# Patient Record
Sex: Female | Born: 1970 | Hispanic: No | Marital: Married | Smoking: Never smoker
Health system: Southern US, Community
[De-identification: ages and names within clinical notes are randomized; demographics above are authoritative.]

## PROBLEM LIST (undated history)

## (undated) DIAGNOSIS — D649 Anemia, unspecified: Secondary | ICD-10-CM

## (undated) DIAGNOSIS — I10 Essential (primary) hypertension: Secondary | ICD-10-CM

## (undated) HISTORY — PX: NO PAST SURGERIES: SHX2092

## (undated) HISTORY — DX: Anemia, unspecified: D64.9

---

## 1998-01-13 ENCOUNTER — Other Ambulatory Visit: Admission: RE | Admit: 1998-01-13 | Discharge: 1998-01-13 | Payer: Self-pay | Admitting: Gynecology

## 1998-01-26 ENCOUNTER — Inpatient Hospital Stay (HOSPITAL_COMMUNITY): Admission: AD | Admit: 1998-01-26 | Discharge: 1998-01-26 | Payer: Self-pay | Admitting: Gynecology

## 1998-01-27 ENCOUNTER — Inpatient Hospital Stay (HOSPITAL_COMMUNITY): Admission: AD | Admit: 1998-01-27 | Discharge: 1998-01-28 | Payer: Self-pay | Admitting: Gynecology

## 1998-03-14 ENCOUNTER — Other Ambulatory Visit: Admission: RE | Admit: 1998-03-14 | Discharge: 1998-03-14 | Payer: Self-pay | Admitting: Gynecology

## 2001-04-14 ENCOUNTER — Other Ambulatory Visit: Admission: RE | Admit: 2001-04-14 | Discharge: 2001-04-14 | Payer: Self-pay | Admitting: *Deleted

## 2001-11-12 ENCOUNTER — Inpatient Hospital Stay (HOSPITAL_COMMUNITY): Admission: AD | Admit: 2001-11-12 | Discharge: 2001-11-14 | Payer: Self-pay | Admitting: *Deleted

## 2001-12-22 ENCOUNTER — Other Ambulatory Visit: Admission: RE | Admit: 2001-12-22 | Discharge: 2001-12-22 | Payer: Self-pay | Admitting: Gynecology

## 2002-12-17 ENCOUNTER — Other Ambulatory Visit: Admission: RE | Admit: 2002-12-17 | Discharge: 2002-12-17 | Payer: Self-pay | Admitting: Gynecology

## 2003-12-20 ENCOUNTER — Other Ambulatory Visit: Admission: RE | Admit: 2003-12-20 | Discharge: 2003-12-20 | Payer: Self-pay | Admitting: Gynecology

## 2005-01-31 ENCOUNTER — Other Ambulatory Visit: Admission: RE | Admit: 2005-01-31 | Discharge: 2005-01-31 | Payer: Self-pay | Admitting: Gynecology

## 2006-02-05 ENCOUNTER — Other Ambulatory Visit: Admission: RE | Admit: 2006-02-05 | Discharge: 2006-02-05 | Payer: Self-pay | Admitting: Gynecology

## 2007-01-16 ENCOUNTER — Other Ambulatory Visit: Admission: RE | Admit: 2007-01-16 | Discharge: 2007-01-16 | Payer: Self-pay | Admitting: Obstetrics & Gynecology

## 2008-03-17 ENCOUNTER — Other Ambulatory Visit: Admission: RE | Admit: 2008-03-17 | Discharge: 2008-03-17 | Payer: Self-pay | Admitting: Obstetrics and Gynecology

## 2008-08-20 DIAGNOSIS — I10 Essential (primary) hypertension: Secondary | ICD-10-CM

## 2008-08-20 HISTORY — DX: Essential (primary) hypertension: I10

## 2011-01-05 NOTE — H&P (Signed)
[facility]  Patient:    Sharon Schwartz, Sharon Schwartz Visit Number: [id number] MRN: [id number]          Service Type: Attending:  Gaetano Hawthorne. Lily Peer, M.D. Dictated by:   Gaetano Hawthorne Lily Peer, M.D. Adm. Date:  11/12/01                           History and Physical  CHIEF COMPLAINT:              Contractions.  HISTORY OF PRESENT ILLNESS:   The patient is a 40 year old gravida 2 para 1, with an estimated date of confinement of November 07, 2001, currently 40-5/7th weeks gestation, who presented to [facility] early this morning complaining of contractions that started at approximately 5 a.m. today.  She was scheduled to be induced next week.  She described good fetal movement and no rupture of membranes.  On presentation to [facility] she was found to be dilated approximately 3-4 cm, 80% effaced, -2 station, with contractions every two to three minutes apart, with reassuring fetal heart rate tracing.  PAST MEDICAL HISTORY:         The patient was seen for a prenatal visit at 10-1/[redacted] weeks gestation and did not return back until 20-1/[redacted] weeks gestation because she had gone to [place] to see her family and missed window of opportunity for maternal serum alpha-fetoprotein.  Most of her prenatal course otherwise had been unremarkable.  REVIEW OF SYSTEMS:            See hospital form.  ALLERGIES:                    The patient denies any allergies.  PAST OBSTETRICAL HISTORY:     She had one normal spontaneous vaginal delivery in 1999 and stated otherwise she has been healthy, and no medical problems or issues had been brought up.  PHYSICAL EXAMINATION:  VITAL SIGNS:                  Blood pressure 111/80, pulse 108, respirations 14, temperature 97.9 degrees.  HEENT:                        Unremarkable.  NECK:                         Supple.  Trachea midline.  No carotid bruits. No thyromegaly.  LUNGS:                        Clear to  auscultation without rhonchi or wheezes.  HEART:                        Regular rate and rhythm without murmurs or gallops.  BREAST:                       Examination not done.  ABDOMEN:                      Gravid uterus, vertex presentation by Leopolds maneuver.  Positive fetal heart tones.  PELVIC:                       Cervix 4+ cm, 80-90% effaced, -2 station.  EXTREMITIES:  Deep tendon reflexes normal, negative clonus.  PRENATAL LABORATORY DATA:     Admission laboratories pending at the time of this dictation but prenatally she had had iron deficiency anemia, for which she had been on iron supplementation.  Platelet count had been normal. AB-positive blood type.  Negative antibody screen.  VDRL nonreactive. Hepatitis B surface antigen negative.  Rubella titer with evidence of immunity.  Pap smear normal.  She had an abnormal diabetes screen but a normal three hour GTT.  GBS culture was negative.  ASSESSMENT:                   This is a 40 year old gravida 2 para 1 at 40-5/7th weeks gestation with labor and advanced cervical dilatation, contractions every two to three minutes apart, reassuring fetal heart rate tracing, patient cervix 4 cm, 90% effaced, -2 station.  Underwent artificial rupture of membranes with clear amniotic fluid.  Fetal scalp electrode and intrauterine pressure catheter were placed.  Will continue to monitor contractions.  In the event of protracted labor will initiate Pitocin accordingly.  Her GBS culture was negative.  PLAN:                         Anticipate vaginal delivery. Dictated by:   Gaetano Hawthorne Lily Peer, M.D. Attending:  Gaetano Hawthorne. Lily Peer, M.D. DD:  11/12/01 TD:  11/12/01 Job: [id number]

## 2011-01-05 NOTE — Discharge Summary (Signed)
[facility]  Patient:    Sharon Schwartz, Sharon Schwartz Visit Number: [id number] MRN: [id number]          Service Type: OBS Location: [id number] Attending Physician:  Tonye Royalty Dictated by:   Antony Contras, [facility] Admit Date:  11/12/2001 Discharge Date: 11/14/2001                             Discharge Summary  DISCHARGE DIAGNOSES: 1. Intrauterine pregnancy at term. 2. Recurrent late decelerations.  PROCEDURES:  Outlet forceps delivery of a viable infant over a midline episiotomy, reduction of tight nuchal cord.  HISTORY OF PRESENT ILLNESS:  The patient is a 40 year old, gravida 2, para 1-0-0-1, last menstrual period 02/01/01, estimated date of confinement 11/07/01. Prenatal course was uncomplicated.  LABORATORY DATA:  AB+.  Antibody screen negative.  RPR, HBSA, HIV nonreactive. GBS is negative.  HOSPITAL COURSE AND TREATMENT:  The patient was admitted at 40-5/7 weeks with spontaneous onset of labor.  During labor she did experience some recurrent late decelerations with slowing recovery.  Delivery was accomplished with outlet forceps over a midline episiotomy.  The patient was delivered of an Apgar 7/8 female infant weighing 6 pounds 8 ounces.  Tight nuchal cord was reduced.  The patient also did sustain a second degree laceration which was repaired.  Cord pH was 7.19.  POSTPARTUM COURSE:  The patient remained afebrile, had no difficulty voiding, was able to be discharged on her second postpartum day in satisfactory condition.  CBC showed a hematocrit of 32.5, hemoglobin 11.2, white blood cell count 17, platelets 160.  DISPOSITION:  The patient is to follow up in six weeks.  DISCHARGE MEDICATIONS: 1. Prenatal vitamins. 2. Iron. 3. Motrin for pain. 4. Tylox for pain. Dictated by:   Antony Contras, [facility] Attending Physician:  Tonye Royalty DD:  12/01/01 TD:  12/01/01 Job: [phone]

## 2012-06-03 ENCOUNTER — Other Ambulatory Visit: Payer: Self-pay | Admitting: Obstetrics and Gynecology

## 2012-06-03 DIAGNOSIS — Z1231 Encounter for screening mammogram for malignant neoplasm of breast: Secondary | ICD-10-CM

## 2012-06-16 ENCOUNTER — Ambulatory Visit (HOSPITAL_COMMUNITY)
Admission: RE | Admit: 2012-06-16 | Discharge: 2012-06-16 | Disposition: A | Discharge status: Home / Self Care | Payer: Self-pay | Source: Ambulatory Visit | Attending: Obstetrics and Gynecology | Admitting: Obstetrics and Gynecology

## 2012-06-16 DIAGNOSIS — Z1231 Encounter for screening mammogram for malignant neoplasm of breast: Secondary | ICD-10-CM

## 2012-06-19 ENCOUNTER — Other Ambulatory Visit: Payer: Self-pay | Admitting: Obstetrics and Gynecology

## 2012-06-19 DIAGNOSIS — R928 Other abnormal and inconclusive findings on diagnostic imaging of breast: Secondary | ICD-10-CM

## 2012-06-23 ENCOUNTER — Ambulatory Visit (HOSPITAL_COMMUNITY)
Admission: RE | Admit: 2012-06-23 | Discharge: 2012-06-23 | Disposition: A | Discharge status: Home / Self Care | Payer: Self-pay | Source: Ambulatory Visit | Attending: Obstetrics and Gynecology | Admitting: Obstetrics and Gynecology

## 2012-06-23 ENCOUNTER — Encounter (HOSPITAL_COMMUNITY): Payer: Self-pay

## 2012-06-23 VITALS — BP 124/86 | Temp 98.9°F | Ht <= 58 in | Wt 153.2 lb

## 2012-06-23 DIAGNOSIS — Z1239 Encounter for other screening for malignant neoplasm of breast: Secondary | ICD-10-CM

## 2012-06-23 HISTORY — DX: Essential (primary) hypertension: I10

## 2012-06-23 NOTE — Progress Notes (Signed)
Patient referred to [facility] from the [facility] due to recommendation of additional imaging of the left breast. Screening mammogram was completed 06/16/2012 at [facility].  Pap Smear:    Pap smear not performed today. Per patient last Pap smear was August 2013 and normal. Per patient she has no history of abnormal Pap smears. No Pap smear results in EPIC.  Physical exam: Breasts Breasts symmetrical. No skin abnormalities bilateral breasts. No nipple retraction bilateral breasts. No nipple discharge bilateral breasts. No lymphadenopathy. No lumps palpated bilateral breasts. No complaints of pain or tenderness on exam. Patient referred to the [facility] of [facility] for left breast diagnostic mammogram and possible left breast ultrasound per recommendation. Appointment scheduled for Friday, July 04, 2012 at 1040.         Pelvic/Bimanual No Pap smear completed today since last Pap smear was August 2013 and normal. Pap smear not indicated per BCCCP guidelines.

## 2012-06-23 NOTE — Patient Instructions (Signed)
Taught patient how to perform BSE and gave educational materials to take home. Patient did not need a Pap smear today due to last Pap smear was in August 2013 per patient. Let her know BCCCP will cover Pap smears every 3 years unless has a history of abnormal Pap smears. Patient referred to the [facility] of [facility] for left breast diagnostic mammogram and possible left breast ultrasound. Appointment scheduled for Friday, July 04, 2012 at 1040. Patient aware of appointment and will be there. Patient verbalized understanding.

## 2012-07-04 ENCOUNTER — Ambulatory Visit
Admission: RE | Admit: 2012-07-04 | Discharge: 2012-07-04 | Disposition: A | Discharge status: Home / Self Care | Payer: No Typology Code available for payment source | Source: Ambulatory Visit | Attending: Obstetrics and Gynecology | Admitting: Obstetrics and Gynecology

## 2012-07-04 DIAGNOSIS — R928 Other abnormal and inconclusive findings on diagnostic imaging of breast: Secondary | ICD-10-CM

## 2012-08-21 NOTE — Addendum Note (Signed)
Encounter addended by: Saintclair Halsted, RN on: 08/21/2012  4:06 PM<BR>     Documentation filed: Visit Diagnoses, Charges VN

## 2013-10-23 ENCOUNTER — Encounter: Payer: Self-pay | Admitting: Gynecology

## 2013-10-23 DIAGNOSIS — I10 Essential (primary) hypertension: Secondary | ICD-10-CM | POA: Insufficient documentation

## 2013-10-26 ENCOUNTER — Encounter: Payer: Self-pay | Admitting: Gynecology

## 2013-10-26 ENCOUNTER — Ambulatory Visit (INDEPENDENT_AMBULATORY_CARE_PROVIDER_SITE_OTHER): Payer: No Typology Code available for payment source | Admitting: Gynecology

## 2013-10-26 VITALS — BP 140/80 | HR 78 | Resp 16 | Ht 59.5 in | Wt 154.0 lb

## 2013-10-26 DIAGNOSIS — Z01419 Encounter for gynecological examination (general) (routine) without abnormal findings: Secondary | ICD-10-CM

## 2013-10-26 DIAGNOSIS — N912 Amenorrhea, unspecified: Secondary | ICD-10-CM

## 2013-10-26 DIAGNOSIS — Z0000 Encounter for general adult medical examination without abnormal findings: Secondary | ICD-10-CM

## 2013-10-26 DIAGNOSIS — N951 Menopausal and female climacteric states: Secondary | ICD-10-CM

## 2013-10-26 DIAGNOSIS — I10 Essential (primary) hypertension: Secondary | ICD-10-CM

## 2013-10-26 LAB — POCT URINALYSIS DIPSTICK
LEUKOCYTES UA: NEGATIVE
PH UA: 5
Urobilinogen, UA: NEGATIVE

## 2013-10-26 LAB — POCT URINE PREGNANCY: PREG TEST UR: NEGATIVE

## 2013-10-26 MED ORDER — VENLAFAXINE HCL ER 37.5 MG PO CP24: 37.5000 mg | ORAL_CAPSULE | Freq: Every day | ORAL | Status: DC

## 2013-10-26 MED ORDER — HYDROCHLOROTHIAZIDE 12.5 MG PO CAPS: 12.5000 mg | ORAL_CAPSULE | Freq: Every day | ORAL | Status: DC

## 2013-10-26 MED ORDER — LISINOPRIL 10 MG PO TABS: 10.0000 mg | ORAL_TABLET | Freq: Every day | ORAL | Status: DC

## 2013-10-26 NOTE — Progress Notes (Signed)
43 y.o. Married Asian female   G2P2002 here for annual exam. Pt is currently sexually active.  Pt reports no menses since d/c ocp in January 2014.  Cycle was very light.  Pt reports a lot of hot flashes and night sweats.  Mother was menopausal at 3040.    Patient's last menstrual period was 09/06/2012.          Sexually active: yes  The current method of none Exercising: yes  walk every day 5x/wk Last pap: 04/14/12 ASCUS - HPV Alcohol: no Tobacco: no BSE: yes  Mammogram: 07/04/12 Bi-Rads 1  Labs: Loyal JacobsonMichael Kalish, MD ; Urine: Negative    Health Maintenance  Topic Date Due  . Tetanus/tdap  08/02/1988  . Influenza Vaccine  03/20/2013  . Pap Smear  04/15/2015    Family History  Problem Relation Age of Onset  . Hypertension Mother     Patient Active Problem List   Diagnosis Date Noted  . Hypertension     Past Medical History  Diagnosis Date  . Hypertension 2010  . Mild anemia     No past surgical history on file.  Allergies: Review of patient's allergies indicates no known allergies.  Current Outpatient Prescriptions  Medication Sig Dispense Refill  . hydrochlorothiazide (MICROZIDE) 12.5 MG capsule Take 12.5 mg by mouth daily.      Marland Kitchen. lisinopril (PRINIVIL,ZESTRIL) 10 MG tablet Take 10 mg by mouth daily.      . Loratadine (CLARITIN PO) Take by mouth as needed.      . Multiple Vitamins-Minerals (MULTIVITAMIN PO) Take by mouth.      . norethindrone (MICRONOR,CAMILA,ERRIN) 0.35 MG tablet Take 1 tablet by mouth daily.       No current facility-administered medications for this visit.    ROS: Pertinent items are noted in HPI.  Exam:    BP 140/80  Pulse 78  Resp 16  Ht 4' 11.5" (1.511 m)  Wt 154 lb (69.854 kg)  BMI 30.60 kg/m2  LMP 09/06/2012 Weight change: @WEIGHTCHANGE @ Last 3 height recordings:  Ht Readings from Last 3 Encounters:  10/26/13 4' 11.5" (1.511 m)  06/23/12 4\' 9"  (1.448 m)   General appearance: alert, cooperative and appears stated age Head:  Normocephalic, without obvious abnormality, atraumatic Neck: no adenopathy, no carotid bruit, no JVD, supple, symmetrical, trachea midline and thyroid not enlarged, symmetric, no tenderness/mass/nodules Lungs: clear to auscultation bilaterally Breasts: normal appearance, no masses or tenderness Heart: regular rate and rhythm, S1, S2 normal, no murmur, click, rub or gallop Abdomen: soft, non-tender; bowel sounds normal; no masses,  no organomegaly Extremities: extremities normal, atraumatic, no cyanosis or edema Skin: Skin color, texture, turgor normal. No rashes or lesions Lymph nodes: Cervical, supraclavicular, and axillary nodes normal. no inguinal nodes palpated Neurologic: Grossly normal   Pelvic: External genitalia:  no lesions              Urethra: normal appearing urethra with no masses, tenderness or lesions              Bartholins and Skenes: normal                 Vagina: normal appearing vagina with normal color and discharge, no lesions              Cervix: normal appearance              Pap taken: no        Bimanual Exam:  Uterus:  uterus is normal size, shape, consistency and  nontender                                      Adnexa:    no masses                                      Rectovaginal: Confirms                                      Anus:  normal sphincter tone, no lesions  A: well woman Amenorrhea Menopausal symptoms HTN     P: mammogram  We discussed non-hormonal options such as SSRI's, SSRI/SNRI's such as effexor and mechanism of action.  Questions addressed.  Pt chooses to try effexor, see orders, to call regarding dose after 3w Refill lisinopril and HCTZ as she ran out at current dose- to f/u with PCP counseled on breast self exam, mammography screening, menopause, adequate intake of calcium and vitamin D, diet and exercise return annually or prn   An After Visit Summary was printed and given to the patient.

## 2013-10-26 NOTE — Patient Instructions (Signed)

## 2013-10-27 LAB — FOLLICLE STIMULATING HORMONE: FSH: 76.9 m[IU]/mL

## 2014-06-21 ENCOUNTER — Encounter: Payer: Self-pay | Admitting: Gynecology

## 2014-07-19 ENCOUNTER — Telehealth: Payer: Self-pay

## 2014-07-19 NOTE — Telephone Encounter (Signed)
AEX reschedule with Ms. Sharon Schwartz on 11/01/14

## 2014-11-01 ENCOUNTER — Encounter: Payer: Self-pay | Admitting: Certified Nurse Midwife

## 2014-11-01 ENCOUNTER — Ambulatory Visit (INDEPENDENT_AMBULATORY_CARE_PROVIDER_SITE_OTHER): Payer: 59 | Admitting: Certified Nurse Midwife

## 2014-11-01 ENCOUNTER — Ambulatory Visit: Payer: No Typology Code available for payment source | Admitting: Gynecology

## 2014-11-01 VITALS — BP 110/70 | HR 70 | Resp 16 | Ht 58.75 in | Wt 150.0 lb

## 2014-11-01 DIAGNOSIS — Z124 Encounter for screening for malignant neoplasm of cervix: Secondary | ICD-10-CM

## 2014-11-01 DIAGNOSIS — Z01419 Encounter for gynecological examination (general) (routine) without abnormal findings: Secondary | ICD-10-CM | POA: Diagnosis not present

## 2014-11-01 DIAGNOSIS — Z0000 Encounter for general adult medical examination without abnormal findings: Secondary | ICD-10-CM

## 2014-11-01 DIAGNOSIS — N950 Postmenopausal bleeding: Secondary | ICD-10-CM

## 2014-11-01 LAB — POCT URINALYSIS DIPSTICK
Bilirubin, UA: NEGATIVE
Blood, UA: NEGATIVE
Glucose, UA: NEGATIVE
Ketones, UA: NEGATIVE
LEUKOCYTES UA: NEGATIVE
NITRITE UA: NEGATIVE
Protein, UA: NEGATIVE
UROBILINOGEN UA: NEGATIVE
pH, UA: 5

## 2014-11-01 NOTE — Progress Notes (Signed)
Would recommend either PUS and decision about doing a biopsy would be made after review of appearance of endometrium.  Or pt could return for endometrial biopsy.  I would probably due to PUS first.  No orders have been placed.  Reviewed personally.  Lum KeasM. Suzanne Jyden Kromer, MD.

## 2014-11-01 NOTE — Patient Instructions (Signed)

## 2014-11-01 NOTE — Addendum Note (Signed)
Addended by: Verner CholLEONARD, Merlyn Bollen S on: 11/01/2014 05:53 PM   Modules accepted: Orders, SmartSet

## 2014-11-01 NOTE — Progress Notes (Signed)
44 y.o. G14P2002 Married  Hispanic Fe here for annual exam.  Post menopausal no HRT, denies vaginal bleeding, but had one day of bleeding on 10-07-14, no cramping. Patient had bleeding similar period. Denies any vaginal symptoms or bleeding with sexual activity. No other bleeding since that time.  Menopausal since 2014 with elevated FSH. Sees PCP yearly for labs and aex and medication management for hypertension. Denies any problems today.  Patient's last menstrual period was 09/06/2012.          Sexually active: Yes.    The current method of family planning is post menopausal status.    Exercising: Yes.    walking Smoker:  no  Health Maintenance: Pap:  04-14-12 ASCUS HPV HR neg MMG:  06-16-12 f/u left breast 07-04-12 birads 1:neg Colonoscopy:  None BMD:   none TDaP:  unsure Labs: Poct urine-neg Self breast exam: done monthly   reports that she has never smoked. She has never used smokeless tobacco. She reports that she does not drink alcohol or use illicit drugs.  Past Medical History  Diagnosis Date  . Hypertension 2010  . Mild anemia     History reviewed. No pertinent past surgical history.  Current Outpatient Prescriptions  Medication Sig Dispense Refill  . hydrochlorothiazide (MICROZIDE) 12.5 MG capsule Take 1 capsule (12.5 mg total) by mouth daily. 30 capsule 2  . lisinopril (PRINIVIL,ZESTRIL) 10 MG tablet Take 1 tablet (10 mg total) by mouth daily. 30 tablet 2  . Loratadine (CLARITIN PO) Take by mouth as needed.    . Multiple Vitamins-Minerals (MULTIVITAMIN PO) Take by mouth.     No current facility-administered medications for this visit.    Family History  Problem Relation Age of Onset  . Hypertension Mother     ROS:  Pertinent items are noted in HPI.  Otherwise, a comprehensive ROS was negative.  Exam:   BP 110/70 mmHg  Pulse 70  Resp 16  Ht 4' 10.75" (1.492 m)  Wt 150 lb (68.04 kg)  BMI 30.57 kg/m2  LMP 09/06/2012 Height: 4' 10.75" (149.2 cm) Ht Readings  from Last 3 Encounters:  11/01/14 4' 10.75" (1.492 m)  10/26/13 4' 11.5" (1.511 m)    General appearance: alert, cooperative and appears stated age Head: Normocephalic, without obvious abnormality, atraumatic Neck: no adenopathy, supple, symmetrical, trachea midline and thyroid normal to inspection and palpation Lungs: clear to auscultation bilaterally Breasts: normal appearance, no masses or tenderness, No nipple retraction or dimpling, No nipple discharge or bleeding, No axillary or supraclavicular adenopathy Heart: regular rate and rhythm Abdomen: soft, non-tender; no masses,  no organomegaly Extremities: extremities normal, atraumatic, no cyanosis or edema Skin: Skin color, texture, turgor normal. No rashes or lesions Lymph nodes: Cervical, supraclavicular, and axillary nodes normal. No abnormal inguinal nodes palpated Neurologic: Grossly normal   Pelvic: External genitalia:  no lesions              Urethra:  normal appearing urethra with no masses, tenderness or lesions              Bartholin's and Skene's: normal                 Vagina: normal appearing vagina with normal color and discharge, no lesions              Cervix: normal appearance, non tender, no lesions, scant blood with pap              Pap taken: Yes.   Bimanual Exam:  Uterus:  normal size, contour, position, consistency, mobility, non-tender and retroverted              Adnexa: normal adnexa and no mass, fullness, tenderness               Rectovaginal: Confirms               Anus:  normal sphincter tone, no lesions  Chaperone present: Yes  A:  Well Woman with normal exam  Postmenopausal with PMB  Immunization update  Hypertension with PCP management   P:   Reviewed health and wellness pertinent to exam  Discussed importance of evaluation with PMB and concern with etiology of. Discussed endometrial biopsy with ? PUS for evaluation. Patient agreeable. Notify if any more bleeding occurs, befroe appointment.  Patient will be called with insurance information and scheduled.  Discussed risks and benefits of TDAP declines.  Continue follow up with PCP as indicated.  Pap smear taken today with HPVHR   counseled on breast self exam, mammography screening, stressed annual mammogram, menopause, adequate intake of calcium and vitamin D, diet and exercise  return annually or prn  An After Visit Summary was printed and given to the patient.

## 2014-11-03 ENCOUNTER — Telehealth: Payer: Self-pay | Admitting: Certified Nurse Midwife

## 2014-11-03 LAB — IPS PAP TEST WITH HPV

## 2014-11-03 NOTE — Telephone Encounter (Signed)
Call to patient. Advised of benefit quote received for PUS/EMB. Patient agreeable. Scheduled procedure. Advised patient of 72 hour cancellation policy and $100 cancellation fee. Patient agreeable.

## 2014-11-04 NOTE — Addendum Note (Signed)
Addended by: Joeseph AmorFAST, Rickey Sadowski L on: 11/04/2014 11:06 AM   Modules accepted: Orders

## 2014-11-18 ENCOUNTER — Ambulatory Visit (INDEPENDENT_AMBULATORY_CARE_PROVIDER_SITE_OTHER): Payer: 59

## 2014-11-18 ENCOUNTER — Ambulatory Visit (INDEPENDENT_AMBULATORY_CARE_PROVIDER_SITE_OTHER): Payer: 59 | Admitting: Obstetrics & Gynecology

## 2014-11-18 ENCOUNTER — Other Ambulatory Visit: Payer: Self-pay | Admitting: Obstetrics & Gynecology

## 2014-11-18 ENCOUNTER — Encounter: Payer: Self-pay | Admitting: Obstetrics & Gynecology

## 2014-11-18 DIAGNOSIS — N950 Postmenopausal bleeding: Secondary | ICD-10-CM

## 2014-11-18 NOTE — Progress Notes (Signed)
44 y.o.  G2P2 Married [place] female here for a pelvic ultrasound with sonohystogram due to PMP bleeding.  Pt had episode of bleeding on 10/07/14.  She has been menopausal since 2014 with elevated FSH at that time.  She is not on HRT.  Denies bleeding or cramping today.  Patient's last menstrual period was 09/06/2012.  Contraception: PMP status  Technique:  Both transabdominal and transvaginal ultrasound examinations of the pelvis were performed. Transabdominal technique was performed for global imaging of the pelvis including uterus, ovaries, adnexal regions, and pelvic cul-de-sac.  It was necessary to proceed with endovaginal exam following the abdominal ultrasound transabdominal exam to visualize the endometrium and adnexa.  Color and duplex Doppler ultrasound was utilized to evaluate blood flow to the ovaries.    FINDINGS: Uterus: 8.1 x 5.7 x 4.0cm with fundal fibroid 3.4cm Endometrium: 6.782mm Adnexa:  Left: 1.1 x 0.7 x 0.6cm     Right: 1.6 x 1.0 x 1.0cm Cul de sac: no free fluid  SHSG:  After obtaining appropriate verbal consent from patient, the cervix was visualized using a speculum, and prepped with betadine.  A tenaculum  was not applied to the cervix.  Dilation of the cervix was not necessary. The catheter was passed into the uterus and sterile saline introduced, with the following findings:  2.0cm polyp noted.    Patient tolerated procedure well.  All instruments removed.  Images reviewed with pt.  Surgical excision of polyp recommended due to size.  Procedure discussed with patient.  Recovery and pain management discussed.  Risks discussed including but not limited to bleeding, rare risk of transfusion, infection, 1% risk of uterine perforation with risks of fluid deficit causing cardiac arrythmia, cerebral swelling and/or need to stop procedure early.  Fluid emboli and rare risk of death discussed.  DVT/PE, rare risk of risk of bowel/bladder/ureteral/vascular injury.  Patient aware if  pathology abnormal she may need additional treatment.  All questions answered.     Assessment:  Endometrial polyp with PMP bleeding  Plan:  Hysteroscopy with polyp excision recommended.  Will send pt's information to Billie RuddySally Yeakley, RN, for scheduling.    ~25 minutes spent with patient >50% of time was in face to face discussion of above.

## 2014-11-26 ENCOUNTER — Telehealth: Payer: Self-pay | Admitting: *Deleted

## 2014-11-26 NOTE — Telephone Encounter (Signed)
Call to patient to discuss surgery date options. Patient declines dates available in April. Agreeable to Dec 27, 2014. Advised will proceed with scheduling and call her back.

## 2014-11-28 ENCOUNTER — Other Ambulatory Visit: Payer: Self-pay | Admitting: Obstetrics & Gynecology

## 2014-11-29 NOTE — Telephone Encounter (Signed)
Spoke with patient. Advised of benefit quote received for the surgeon portion of her surgery. Advised that payment is due in full at least 3 weeks prior to the scheduled surgery date. Payment due date 04.18.2016. Patient agreeable. Advised patient that she will receive separate communication from the hospital regarding facility charges/fees/payment.   [facility] and cpt code [id number] so that she can call them to discuss benefits for the facility charges. Patient agreeable.

## 2014-12-01 NOTE — Telephone Encounter (Signed)
[facility] requesting to speak with Sharon LeeSabrina about a prior-authorization.

## 2014-12-01 NOTE — Telephone Encounter (Signed)
Returned call to Stony PrairieJessica. [employer] wanted to confirm that surgery was being performed at a hospital vs ambulatory surgery center.

## 2014-12-06 ENCOUNTER — Encounter: Payer: Self-pay | Admitting: Certified Nurse Midwife

## 2014-12-08 ENCOUNTER — Telehealth: Payer: Self-pay | Admitting: Emergency Medicine

## 2014-12-08 NOTE — Telephone Encounter (Signed)
Spoke with patient. She wanted to know what would happen if she decided not to have surgery. Advised patient per Dr. Rondel BatonMiller's note, patient with large polyp. Advised patient could continue to have post menopausal bleeding. Patient has multiple questions regarding need for surgery, and risks and benefits. Advised will need to come in for consult with Dr. Hyacinth MeekerMiller to discuss further and have all questions answered. Patient agreeable. Scheduled consult for 12/13/14. Surgery date scheduled for 12/27/14.   Routing to Billie RuddySally Yeakley, RN and Dr. Hyacinth MeekerMiller.

## 2014-12-08 NOTE — Telephone Encounter (Signed)
  Non-Urgent Medical Question  Message [phone]   From  Flannery Cherre BlancMee Mccarrell   To  Verner CholDeborah S Leonard, CNM   Sent  12/06/2014 5:44 PM     I have my surgery appointment on may 9,what will happen if I don't do the surgery ?      Responsible Party    Pool - [facility] Clinical Pool No one has taken responsibility for this message.     No actions have been taken on this message.

## 2014-12-08 NOTE — Telephone Encounter (Signed)
Called patient with telephone encounter and scheduled consult.  Routing to provider for final review. Patient agreeable to disposition. Will close encounter

## 2014-12-09 NOTE — Telephone Encounter (Signed)
Patient to meet financial obligation at pre-op appointment.

## 2014-12-09 NOTE — Telephone Encounter (Signed)
Left message for patient to call back. Surgery payment

## 2014-12-13 ENCOUNTER — Ambulatory Visit (INDEPENDENT_AMBULATORY_CARE_PROVIDER_SITE_OTHER): Payer: 59 | Admitting: Obstetrics & Gynecology

## 2014-12-13 VITALS — BP 118/80 | HR 64 | Resp 16 | Wt 154.2 lb

## 2014-12-13 DIAGNOSIS — N840 Polyp of corpus uteri: Secondary | ICD-10-CM

## 2014-12-13 DIAGNOSIS — N950 Postmenopausal bleeding: Secondary | ICD-10-CM

## 2014-12-13 MED ORDER — IBUPROFEN 800 MG PO TABS: 800.0000 mg | ORAL_TABLET | Freq: Three times a day (TID) | ORAL | Status: DC | PRN

## 2014-12-13 MED ORDER — HYDROCODONE-ACETAMINOPHEN 5-325 MG PO TABS: 1.0000 | ORAL_TABLET | Freq: Four times a day (QID) | ORAL | Status: DC | PRN

## 2014-12-13 NOTE — Progress Notes (Signed)
44 y.o. [id number] MarriedHispanic female here for discussion of upcoming procedure.  Pt called earlier to cancel this. D/w pt recommended proceudure--D&C/hysteroscopy which I have recommended due to PMP bleeding and finding of endometrial polyp on U/S.  Pre-op evaluation thus far has included PUS 11/18/14 showing 8.1 x 5.7 x 4.0cm uterus with 3.4cm uterine fibroid and 2.0cm polyp.  Ovaries appeared normal.  Reviewed reasoning for procedure and pt voiced understanding and desires to proceed.  Actual procedure discussed with patient.  Recovery and pain management discussed.  Risks discussed including but not limited to bleeding, rare risk of transfusion, infection, 1% risk of uterine perforation with risks of fluid deficit causing cardiac arrythmia, cerebral swelling and/or need to stop procedure early.  Fluid emboli and rare risk of death discussed.  DVT/PE, rare risk of risk of bowel/bladder/ureteral/vascular injury.  Patient aware if pathology abnormal she may need additional treatment.  All questions answered.    Ob Hx:   Patient's last menstrual period was 09/06/2012.  PMB 10/07/14        Sexually active: Yes.   Birth control: no method Last pap: 11/01/14 WNL/negative HR HPV Last MMG:06/16/12-MMG, 07/04/12 left breast f/u-normal Tobacco: no  No past surgical history on file.  Past Medical History  Diagnosis Date  . Hypertension 2010  . Mild anemia     Allergies: Review of patient's allergies indicates no known allergies.  Current Outpatient Prescriptions  Medication Sig Dispense Refill  . hydrochlorothiazide (MICROZIDE) 12.5 MG capsule Take 1 capsule (12.5 mg total) by mouth daily. 30 capsule 2  . lisinopril (PRINIVIL,ZESTRIL) 10 MG tablet Take 1 tablet (10 mg total) by mouth daily. 30 tablet 2  . Loratadine (CLARITIN PO) Take by mouth as needed.    . Multiple Vitamins-Minerals (MULTIVITAMIN PO) Take by mouth.     No current facility-administered medications for this visit.    ROS: A  comprehensive review of systems was negative.  Exam:    LMP 09/06/2012  General appearance: alert and cooperative Head: Normocephalic, without obvious abnormality, atraumatic Neck: no adenopathy, supple, symmetrical, trachea midline and thyroid not enlarged, symmetric, no tenderness/mass/nodules Lungs: clear to auscultation bilaterally Heart: regular rate and rhythm, S1, S2 normal, no murmur, click, rub or gallop Abdomen: soft, non-tender; bowel sounds normal; no masses,  no organomegaly Extremities: extremities normal, atraumatic, no cyanosis or edema Skin: Skin color, texture, turgor normal. No rashes or lesions Lymph nodes: Cervical, supraclavicular, and axillary nodes normal. no inguinal nodes palpated Neurologic: Grossly normal  Pelvic:  Pt declined exam today  A: PMP bleeding (menopausal since 1/14) Endometrial polyp 3.4cm fundal fibroid Hypertension    P:   Hysteroscopy with polyp resection, D&C planned Rx for Motrin and Vicodin given. Medications/Vitamins reviewed.  Pt knows needs to stop aspirin products. Hysterectomy brochure given for pre and post op instructions.  ~20 minutes spent with patient >50% of time was in face to face discussion of above.

## 2014-12-14 ENCOUNTER — Encounter: Payer: Self-pay | Admitting: Obstetrics & Gynecology

## 2014-12-14 DIAGNOSIS — N840 Polyp of corpus uteri: Secondary | ICD-10-CM | POA: Insufficient documentation

## 2014-12-14 DIAGNOSIS — N950 Postmenopausal bleeding: Secondary | ICD-10-CM | POA: Insufficient documentation

## 2014-12-15 ENCOUNTER — Other Ambulatory Visit: Payer: Self-pay

## 2014-12-15 ENCOUNTER — Encounter (HOSPITAL_COMMUNITY)
Admission: RE | Admit: 2014-12-15 | Discharge: 2014-12-15 | Disposition: A | Discharge status: Home / Self Care | Payer: 59 | Source: Ambulatory Visit | Attending: Physical Medicine and Rehabilitation | Admitting: Physical Medicine and Rehabilitation

## 2014-12-15 ENCOUNTER — Encounter (HOSPITAL_COMMUNITY): Payer: Self-pay

## 2014-12-15 DIAGNOSIS — Z01812 Encounter for preprocedural laboratory examination: Secondary | ICD-10-CM | POA: Diagnosis not present

## 2014-12-15 DIAGNOSIS — N840 Polyp of corpus uteri: Secondary | ICD-10-CM | POA: Insufficient documentation

## 2014-12-15 LAB — CBC
HCT: 44.6 % (ref 36.0–46.0)
HEMOGLOBIN: 15.3 g/dL — AB (ref 12.0–15.0)
MCH: 31.2 pg (ref 26.0–34.0)
MCHC: 34.3 g/dL (ref 30.0–36.0)
MCV: 90.8 fL (ref 78.0–100.0)
Platelets: 233 10*3/uL (ref 150–400)
RBC: 4.91 MIL/uL (ref 3.87–5.11)
RDW: 13.3 % (ref 11.5–15.5)
WBC: 10.7 10*3/uL — ABNORMAL HIGH (ref 4.0–10.5)

## 2014-12-15 LAB — BASIC METABOLIC PANEL
Anion gap: 10 (ref 5–15)
BUN: 15 mg/dL (ref 6–23)
CHLORIDE: 102 mmol/L (ref 96–112)
CO2: 27 mmol/L (ref 19–32)
CREATININE: 0.76 mg/dL (ref 0.50–1.10)
Calcium: 9.7 mg/dL (ref 8.4–10.5)
GFR calc non Af Amer: >90 mL/min (ref >90)
Glucose, Bld: 133 mg/dL — ABNORMAL HIGH (ref 70–99)
Potassium: 3.9 mmol/L (ref 3.5–5.1)
Sodium: 139 mmol/L (ref 135–145)

## 2014-12-15 NOTE — Patient Instructions (Signed)
Your procedure is scheduled on:12/27/14  Enter through the Main Entrance at :0930 am Pick up desk phone and dial [phone] and inform us of your arrival.  Please call [phone] if you have any problems the morning of surgery.  Remember: Do not eat food or drink liquids, after midnight:Sunday    You may brush your teeth the morning of surgery.  Take these meds the morning of surgery with a sip of water: blood pressure pills  DO NOT wear jewelry, eye make-up, lipstick,body lotion, or dark fingernail polish.  (Polished toes are ok) You may wear deodorant.  If you are to be admitted after surgery, leave suitcase in car until your room has been assigned. Patients discharged on the day of surgery will not be allowed to drive home. Wear loose fitting, comfortable clothes for your ride home.

## 2014-12-21 ENCOUNTER — Telehealth: Payer: Self-pay | Admitting: *Deleted

## 2014-12-21 NOTE — Telephone Encounter (Signed)
See next phone note.  Routing to provider for final review.  Will close encounter.

## 2014-12-21 NOTE — Telephone Encounter (Signed)
Call to patient, surgery instruction sheet reviewed and printed copy mailed, see scanned copy. Patient states hospital and Dr Hyacinth MeekerMiller have reviewed this with her.  Routing to provider for final review. Patient agreeable to disposition. Patient aware MD will review message and nurse will return call with any additional instructions or change of disposition. Will close encounter.

## 2014-12-27 ENCOUNTER — Encounter (HOSPITAL_COMMUNITY)
Admission: RE | Disposition: A | Discharge status: Home / Self Care | Payer: Self-pay | Source: Ambulatory Visit | Attending: Obstetrics & Gynecology

## 2014-12-27 ENCOUNTER — Ambulatory Visit (HOSPITAL_COMMUNITY): Payer: 59 | Admitting: Anesthesiology

## 2014-12-27 ENCOUNTER — Encounter (HOSPITAL_COMMUNITY): Payer: Self-pay | Admitting: Anesthesiology

## 2014-12-27 ENCOUNTER — Ambulatory Visit (HOSPITAL_COMMUNITY)
Admission: RE | Admit: 2014-12-27 | Discharge: 2014-12-27 | Disposition: A | Discharge status: Home / Self Care | Payer: 59 | Source: Ambulatory Visit | Attending: Obstetrics & Gynecology | Admitting: Obstetrics & Gynecology

## 2014-12-27 DIAGNOSIS — N840 Polyp of corpus uteri: Secondary | ICD-10-CM

## 2014-12-27 DIAGNOSIS — Z79899 Other long term (current) drug therapy: Secondary | ICD-10-CM | POA: Insufficient documentation

## 2014-12-27 DIAGNOSIS — I10 Essential (primary) hypertension: Secondary | ICD-10-CM | POA: Diagnosis not present

## 2014-12-27 DIAGNOSIS — N950 Postmenopausal bleeding: Secondary | ICD-10-CM | POA: Diagnosis present

## 2014-12-27 HISTORY — PX: DILATATION & CURRETTAGE/HYSTEROSCOPY WITH RESECTOCOPE: SHX5572

## 2014-12-27 LAB — PREGNANCY, URINE: Preg Test, Ur: NEGATIVE

## 2014-12-27 SURGERY — DILATATION & CURETTAGE/HYSTEROSCOPY WITH RESECTOCOPE
Anesthesia: General | Site: Vagina

## 2014-12-27 MED ORDER — LIDOCAINE HCL (CARDIAC) 20 MG/ML IV SOLN
INTRAVENOUS | Status: AC
  Filled 2014-12-27: qty 5

## 2014-12-27 MED ORDER — FENTANYL CITRATE (PF) 250 MCG/5ML IJ SOLN
INTRAMUSCULAR | Status: AC
  Filled 2014-12-27: qty 5

## 2014-12-27 MED ORDER — MIDAZOLAM HCL 2 MG/2ML IJ SOLN
INTRAMUSCULAR | Status: AC
  Filled 2014-12-27: qty 2

## 2014-12-27 MED ORDER — PROPOFOL 10 MG/ML IV BOLUS
INTRAVENOUS | Status: DC | PRN
  Administered 2014-12-27: 130 mg via INTRAVENOUS
  Administered 2014-12-27: 20 mg via INTRAVENOUS
  Administered 2014-12-27: 50 mg via INTRAVENOUS

## 2014-12-27 MED ORDER — ONDANSETRON HCL 4 MG/2ML IJ SOLN
INTRAMUSCULAR | Status: DC | PRN
  Administered 2014-12-27: 4 mg via INTRAVENOUS

## 2014-12-27 MED ORDER — SCOPOLAMINE 1 MG/3DAYS TD PT72: 1.0000 | MEDICATED_PATCH | Freq: Once | TRANSDERMAL | Status: DC

## 2014-12-27 MED ORDER — GLYCINE 1.5 % IR SOLN
Status: DC | PRN
  Administered 2014-12-27: 3000 mL

## 2014-12-27 MED ORDER — KETOROLAC TROMETHAMINE 30 MG/ML IJ SOLN
INTRAMUSCULAR | Status: DC | PRN
  Administered 2014-12-27: 30 mg via INTRAVENOUS

## 2014-12-27 MED ORDER — ONDANSETRON HCL 4 MG/2ML IJ SOLN: INTRAMUSCULAR | Status: DC | PRN

## 2014-12-27 MED ORDER — MIDAZOLAM HCL 5 MG/5ML IJ SOLN
INTRAMUSCULAR | Status: DC | PRN
  Administered 2014-12-27: 2 mg via INTRAVENOUS

## 2014-12-27 MED ORDER — CEFAZOLIN SODIUM-DEXTROSE 2-3 GM-% IV SOLR
INTRAVENOUS | Status: AC
  Filled 2014-12-27: qty 50

## 2014-12-27 MED ORDER — FENTANYL CITRATE (PF) 100 MCG/2ML IJ SOLN: 25.0000 ug | INTRAMUSCULAR | Status: DC | PRN

## 2014-12-27 MED ORDER — MEPERIDINE HCL 25 MG/ML IJ SOLN: 6.2500 mg | INTRAMUSCULAR | Status: DC | PRN

## 2014-12-27 MED ORDER — GLYCOPYRROLATE 0.2 MG/ML IJ SOLN
INTRAMUSCULAR | Status: DC | PRN
  Administered 2014-12-27: 0.2 mg via INTRAVENOUS

## 2014-12-27 MED ORDER — LIDOCAINE-EPINEPHRINE 1 %-1:100000 IJ SOLN
INTRAMUSCULAR | Status: AC
  Filled 2014-12-27: qty 1

## 2014-12-27 MED ORDER — PROPOFOL 10 MG/ML IV BOLUS
INTRAVENOUS | Status: AC
  Filled 2014-12-27: qty 20

## 2014-12-27 MED ORDER — LACTATED RINGERS IV SOLN
INTRAVENOUS | Status: DC
  Administered 2014-12-27 (×3): via INTRAVENOUS

## 2014-12-27 MED ORDER — CEFAZOLIN SODIUM-DEXTROSE 2-3 GM-% IV SOLR
2.0000 g | INTRAVENOUS | Status: AC
  Administered 2014-12-27: 2 g via INTRAVENOUS

## 2014-12-27 MED ORDER — SCOPOLAMINE 1 MG/3DAYS TD PT72
MEDICATED_PATCH | TRANSDERMAL | Status: AC
  Filled 2014-12-27: qty 1

## 2014-12-27 MED ORDER — KETOROLAC TROMETHAMINE 30 MG/ML IJ SOLN
30.0000 mg | Freq: Once | INTRAMUSCULAR | Status: DC | PRN
Start: 2014-12-27 — End: 2014-12-27

## 2014-12-27 MED ORDER — ONDANSETRON HCL 4 MG/2ML IJ SOLN
INTRAMUSCULAR | Status: AC
  Filled 2014-12-27: qty 2

## 2014-12-27 MED ORDER — LIDOCAINE HCL (CARDIAC) 20 MG/ML IV SOLN
INTRAVENOUS | Status: DC | PRN
  Administered 2014-12-27 (×2): 50 mg via INTRAVENOUS

## 2014-12-27 MED ORDER — LIDOCAINE-EPINEPHRINE 1 %-1:100000 IJ SOLN
INTRAMUSCULAR | Status: DC | PRN
  Administered 2014-12-27: 10 mL

## 2014-12-27 MED ORDER — FENTANYL CITRATE (PF) 100 MCG/2ML IJ SOLN
INTRAMUSCULAR | Status: DC | PRN
  Administered 2014-12-27 (×2): 50 ug via INTRAVENOUS
  Administered 2014-12-27: 100 ug via INTRAVENOUS

## 2014-12-27 MED ORDER — SILVER NITRATE-POT NITRATE 75-25 % EX MISC
CUTANEOUS | Status: AC
  Filled 2014-12-27: qty 1

## 2014-12-27 MED ORDER — ONDANSETRON HCL 4 MG/2ML IJ SOLN: 4.0000 mg | Freq: Once | INTRAMUSCULAR | Status: DC | PRN

## 2014-12-27 SURGICAL SUPPLY — 18 items
CANISTER SUCT 3000ML (MISCELLANEOUS) ×3 IMPLANT
CATH ROBINSON RED A/P 16FR (CATHETERS) ×3 IMPLANT
CLOTH BEACON ORANGE TIMEOUT ST (SAFETY) ×3 IMPLANT
CONTAINER PREFILL 10% NBF 60ML (FORM) ×6 IMPLANT
DILATOR CANAL MILEX (MISCELLANEOUS) IMPLANT
ELECT REM PT RETURN 9FT ADLT (ELECTROSURGICAL)
ELECTRODE REM PT RTRN 9FT ADLT (ELECTROSURGICAL) IMPLANT
GLOVE BIOGEL PI IND STRL 7.0 (GLOVE) ×1 IMPLANT
GLOVE BIOGEL PI INDICATOR 7.0 (GLOVE) ×2
GLOVE ECLIPSE 6.5 STRL STRAW (GLOVE) ×6 IMPLANT
GOWN STRL REUS W/TWL LRG LVL3 (GOWN DISPOSABLE) ×6 IMPLANT
LOOP ANGLED CUTTING 22FR (CUTTING LOOP) IMPLANT
PACK VAGINAL MINOR WOMEN LF (CUSTOM PROCEDURE TRAY) ×3 IMPLANT
PAD OB MATERNITY 4.3X12.25 (PERSONAL CARE ITEMS) ×3 IMPLANT
TOWEL OR 17X24 6PK STRL BLUE (TOWEL DISPOSABLE) ×6 IMPLANT
TUBING AQUILEX INFLOW (TUBING) ×3 IMPLANT
TUBING AQUILEX OUTFLOW (TUBING) ×3 IMPLANT
WATER STERILE IRR 1000ML POUR (IV SOLUTION) ×3 IMPLANT

## 2014-12-27 NOTE — Anesthesia Postprocedure Evaluation (Signed)
Anesthesia Post Note  Patient: Sharon Schwartz  Procedure(s) Performed: Procedure(s) (LRB): DILATATION & CURETTAGE/HYSTEROSCOPY WITH RESECTOCOPE (N/A)  Anesthesia type: General  Patient location: PACU  Post pain: Pain level controlled  Post assessment: Post-op Vital signs reviewed  Last Vitals:  Filed Vitals:   12/27/14 1500  BP: 91/54  Pulse: 99  Temp:   Resp: 16    Post vital signs: Reviewed  Level of consciousness: sedated  Complications: No apparent anesthesia complications

## 2014-12-27 NOTE — H&P (Signed)
Sharon Schwartz is an 44 y.o. female  782P2 MAsian female here for hysteroscopic polyp resection of a 2cm endometrial polyp that has caused bleeding.  Ultrasound was done 10/21/14.  No biopsy was obtained as the polyp was large enough to recommend surgical removal and pathology will be sent today.  Pt has been menopausal since 2014.  Procedure, risks, benefits, and alternatives have been discussed although I did recommend surgical excision.  Pt has ahad all questions answered and is here and ready to proceed.  Pertinent Gynecological History: Menses: post-menopausal Bleeding: post menopausal bleeding Contraception: none DES exposure: denies Blood transfusions: none Sexually transmitted diseases: no past history Previous GYN Procedures: none  Last mammogram: normal Date: 10/13 Last pap: normal Date: 3/16 OB History: G2, P2   Menstrual History: Patient's last menstrual period was 09/06/2012.    Past Medical History  Diagnosis Date  . Hypertension 2010  . Mild anemia     Past Surgical History  Procedure Laterality Date  . No past surgeries      Family History  Problem Relation Age of Onset  . Hypertension Mother     Social History:  reports that she has never smoked. She has never used smokeless tobacco. She reports that she does not drink alcohol or use illicit drugs.  Allergies: No Known Allergies  Prescriptions prior to admission  Medication Sig Dispense Refill Last Dose  . lisinopril-hydrochlorothiazide (PRINZIDE,ZESTORETIC) 20-25 MG per tablet Take 1 tablet by mouth daily.   12/27/2014 at 0830  . Loratadine (CLARITIN PO) Take 1 tablet by mouth daily as needed (for allergies).    Taking  . Multiple Vitamins-Minerals (MULTIVITAMIN PO) Take 1 tablet by mouth daily.    Taking  . hydrochlorothiazide (MICROZIDE) 12.5 MG capsule Take 1 capsule (12.5 mg total) by mouth daily. (Patient not taking: Reported on 12/13/2014) 30 capsule 2 Taking  . HYDROcodone-acetaminophen (NORCO/VICODIN)  5-325 MG per tablet Take 1-2 tablets by mouth every 6 (six) hours as needed for moderate pain or severe pain. (Patient not taking: Reported on 12/13/2014) 15 tablet 0   . ibuprofen (ADVIL,MOTRIN) 800 MG tablet Take 1 tablet (800 mg total) by mouth every 8 (eight) hours as needed. (Patient not taking: Reported on 12/13/2014) 30 tablet 0   . lisinopril (PRINIVIL,ZESTRIL) 10 MG tablet Take 1 tablet (10 mg total) by mouth daily. (Patient not taking: Reported on 12/13/2014) 30 tablet 2 Taking    Review of Systems  All other systems reviewed and are negative.   Blood pressure 117/76, pulse 86, temperature 97.7 F (36.5 C), temperature source Oral, last menstrual period 09/06/2012, SpO2 98 %. Physical Exam  Vitals reviewed. Constitutional: She is oriented to person, place, and time. She appears well-developed and well-nourished.  Cardiovascular: Normal rate and regular rhythm.   Respiratory: Effort normal and breath sounds normal.  Neurological: She is alert and oriented to person, place, and time.  Psychiatric: She has a normal mood and affect.    Results for orders placed or performed during the hospital encounter of 12/27/14 (from the past 24 hour(s))  Pregnancy, urine     Status: None   Collection Time: 12/27/14  9:55 AM  Result Value Ref Range   Preg Test, Ur NEGATIVE NEGATIVE    No results found.  Assessment/Plan: 44 yo G2P2 MWF here for hysteroscopic polyp resection with D&C due to a 2cm polyp and PMP bleeding.  Pt here and ready to proceed.  Valentina ShaggyMILLER, Cristin Szatkowski SUZANNE 12/27/2014, 11:33 AM

## 2014-12-27 NOTE — Anesthesia Preprocedure Evaluation (Signed)
Anesthesia Evaluation  Patient identified by MRN, date of birth, ID band  Reviewed: Allergy & Precautions, H&P , NPO status , Patient's Chart, lab work & pertinent test results  Airway Mallampati: I  TM Distance: >3 FB Neck ROM: full    Dental  (+) Teeth Intact   Pulmonary neg pulmonary ROS,  breath sounds clear to auscultation  Pulmonary exam normal       Cardiovascular hypertension, Pt. on medications Normal cardiovascular examRate:Normal     Neuro/Psych negative neurological ROS  negative psych ROS   GI/Hepatic negative GI ROS, Neg liver ROS,   Endo/Other  negative endocrine ROS  Renal/GU negative Renal ROS     Musculoskeletal   Abdominal Normal abdominal exam  (+)   Peds  Hematology   Anesthesia Other Findings   Reproductive/Obstetrics negative OB ROS                             Anesthesia Physical Anesthesia Plan  ASA: II  Anesthesia Plan: General   Post-op Pain Management:    Induction: Intravenous  Airway Management Planned: LMA  Additional Equipment:   Intra-op Plan:   Post-operative Plan:   Informed Consent: I have reviewed the patients History and Physical, chart, labs and discussed the procedure including the risks, benefits and alternatives for the proposed anesthesia with the patient or authorized representative who has indicated his/her understanding and acceptance.     Plan Discussed with: CRNA and Surgeon  Anesthesia Plan Comments:         Anesthesia Quick Evaluation

## 2014-12-27 NOTE — Transfer of Care (Signed)
Immediate Anesthesia Transfer of Care Note  Patient: Sharon Schwartz  Procedure(s) Performed: Procedure(s): DILATATION & CURETTAGE/HYSTEROSCOPY WITH RESECTOCOPE (N/A)  Patient Location: PACU  Anesthesia Type:General  Level of Consciousness: awake and sedated  Airway & Oxygen Therapy: Patient Spontanous Breathing and Patient connected to nasal cannula oxygen  Post-op Assessment: Report given to RN and Post -op Vital signs reviewed and stable  Post vital signs: Reviewed and stable  Last Vitals:  Filed Vitals:   12/27/14 0959  BP: 117/76  Pulse: 86  Temp: 36.5 C    Complications: No apparent anesthesia complications

## 2014-12-27 NOTE — Discharge Instructions (Signed)
Post-surgical Instructions, Outpatient Surgery  You may expect to feel dizzy, weak, and drowsy for as long as 24 hours after receiving the medicine that made you sleep (anesthetic). For the first 24 hours after your surgery:   1. Do not drive a car, ride a bicycle, participate in physical activities, or take public transportation until you are done taking narcotic pain medicines or as directed by Dr. Hyacinth MeekerMiller.  2. Do not drink alcohol or take tranquilizers.  3. Do not take medicine that has not been prescribed by your physicians.  4. Do not sign important papers or make important decisions while on narcotic pain medicines.  5. Have a responsible person with you.   PAIN MANAGEMENT 1. Motrin 800mg .  (This is the same as 4-200mg  over the counter tablets of Motrin or ibuprofen.)  You may take this every eight hours or as needed for cramping.   2. Vicodin 5/325mg .  For more severe pain, take one or two tablets every four to six hours as needed for pain control.  (Remember that narcotic pain medications increase your risk of constipation.  If this becomes a problem, you may take an over the counter stool softener like Colace 100mg  up to four times a day.)  DO'S AND DON'T'S 6. Do not take a tub bath for one week.  You may shower on the first day after your surgery 7. Do not do any heavy lifting for one to two weeks.  This increases the chance of bleeding. 8. Do move around as you feel able.  Stairs are fine.  You may begin to exercise again as you feel able.  Do not lift any weights for two weeks. 9. Do not put anything in the vagina for two weeks--no tampons, intercourse, or douching.    REGULAR MEDIATIONS/VITAMINS: 3. You may restart all of your regular medications as prescribed. 4. You may restart all of your vitamins as you normally take them.    PLEASE CALL OR SEEK MEDICAL CARE IF: 5. You have persistent nausea and vomiting.  6. You have trouble eating or drinking.  7. You have an oral  temperature above 100.5.  8. You have constipation that is not helped by adjusting diet or increasing fluid intake. Pain medicines are a common cause of constipation.   You have heavy vaginal bleeding  DISCHARGE INSTRUCTIONS: D&C / D&E The following instructions have been prepared to help you care for yourself upon your return home.   Personal hygiene:  Use sanitary pads for vaginal drainage, not tampons.  Shower the day after your procedure.  NO tub baths, pools or Jacuzzis for 2-3 weeks.  Wipe front to back after using the bathroom.  Activity and limitations:  Do NOT drive or operate any equipment for 24 hours. The effects of anesthesia are still present and drowsiness may result.  Do NOT rest in bed all day.  Walking is encouraged.  Walk up and down stairs slowly.  You may resume your normal activity in one to two days or as indicated by your physician.  Sexual activity: NO intercourse for at least 2 weeks after the procedure, or as indicated by your physician.  Diet: Eat a light meal as desired this evening. You may resume your usual diet tomorrow.  Return to work: You may resume your work activities in one to two days or as indicated by your doctor.  What to expect after your surgery: Expect to have vaginal bleeding/discharge for 2-3 days and spotting for up to 10  days. It is not unusual to have soreness for up to 1-2 weeks. You may have a slight burning sensation when you urinate for the first day. Mild cramps may continue for a couple of days. You may have a regular period in 2-6 weeks.  Call your doctor for any of the following:  Excessive vaginal bleeding, saturating and changing one pad every hour.  Inability to urinate 6 hours after discharge from hospital.  Pain not relieved by pain medication.  Fever of 100.4 F or greater.  Unusual vaginal discharge or odor.   Call for an appointment:    Patients signature: ______________________  Nurses  signature ________________________  Support person's signature_______________________    Post Anesthesia Home Care Instructions  Activity: Get plenty of rest for the remainder of the day. A responsible adult should stay with you for 24 hours following the procedure.  For the next 24 hours, DO NOT: -Drive a car -Advertising copywriterperate machinery -Drink alcoholic beverages -Take any medication unless instructed by your physician -Make any legal decisions or sign important papers.  Meals: Start with liquid foods such as gelatin or soup. Progress to regular foods as tolerated. Avoid greasy, spicy, heavy foods. If nausea and/or vomiting occur, drink only clear liquids until the nausea and/or vomiting subsides. Call your physician if vomiting continues.  Special Instructions/Symptoms: Your throat may feel dry or sore from the anesthesia or the breathing tube placed in your throat during surgery. If this causes discomfort, gargle with warm salt water. The discomfort should disappear within 24 hours.  If you had a scopolamine patch placed behind your ear for the management of post- operative nausea and/or vomiting:  1. The medication in the patch is effective for 72 hours, after which it should be removed.  Wrap patch in a tissue and discard in the trash. Wash hands thoroughly with soap and water. 2. You may remove the patch earlier than 72 hours if you experience unpleasant side effects which may include dry mouth, dizziness or visual disturbances. 3. Avoid touching the patch. Wash your hands with soap and water after contact with the patch.

## 2014-12-27 NOTE — Op Note (Signed)
12/27/2014  2:36 PM  PATIENT:  Sharon Schwartz  44 y.o. female  PRE-OPERATIVE DIAGNOSIS:  PMB, endometrial polyp  POST-OPERATIVE DIAGNOSIS:  PMB, endometrial polyp  PROCEDURE:  Procedure(s): DILATATION & CURETTAGE/HYSTEROSCOPY WITH RESECTOCOPE  SURGEON:  Freeda Spivey SUZANNE  ASSISTANTS: OR staff   ANESTHESIA:   general  ESTIMATED BLOOD LOSS: 10cc  BLOOD ADMINISTERED:none   FLUIDS: 1200cc lR  UOP: 200cc UOP drained at beginning of procedure  SPECIMEN:  Endometrial polyp and curetting tissue  DISPOSITION OF SPECIMEN:  PATHOLOGY  FINDINGS: thin endometrium, large endometrial polyp  DESCRIPTION OF OPERATION: Patient was taken to the operating room.  She is placed in the supine position. SCDs were on her lower extremities and functioning properly. General anesthesia with an LMA was administered without difficulty.   Legs were then placed in the [facility] stirrups in the low lithotomy position. The legs were lifted to the high lithotomy position and the Betadine prep was used on the inner thighs perineum and vagina x3. Patient was draped in a normal standard fashion. An in and out catheterization with a red rubber Foley catheter was performed. Approximately 200cc of clear urine was noted. A bivalve speculum was placed the vagina. The anterior lip of the cervix was grasped with single-tooth tenaculum.  A paracervical block of 1% lidocaine mixed one-to-one with epinephrine (1:100,000 units).  10 cc was used total. The cervix is dilated up to #23 [facility] dilators. The endometrial cavity sounded to 7cm.   A 2.9 millimeter diagnostic hysteroscope was obtained. 1.5% glycine was used as a hysteroscopic fluid. The hysteroscope was advanced through the endocervical canal into the endometrial cavity. A large endometrial polyp was noted.  The endometrium was thin.  Attachment was left side of endometrial cavity.  The tubal ostia were noted bilaterally. The hysteroscope was removed. A polyp forceps was  used to grasp the polyp.  A large portion of it was removed.  The cervix was then dilated up to a #29.  The resectoscope with single loop attached was used to fully resect the polyp and cauterize along the base of the polyp.  The cavity was clear of all polyp tissue at this time.  The hysteroscope was removed again.  Then a #1 smooth curette was used to curette the cavity until rough gritty texture was noted in all quadrants.  At this point no other procedure was needed.  The fluid deficit was 250cc, inclusive of fluid that was on the OR floor. The tenaculum was removed from the anterior lip of the cervix. The speculum was removed from the vagina. The prep was cleansed of the patient's skin. The legs are positioned back in the supine position. Sponge, lap, needle, initially counts were correct x2. Patient was taken to recovery in stable condition.  COUNTS:  YES  PLAN OF CARE: Transfer to PACU

## 2014-12-28 ENCOUNTER — Encounter (HOSPITAL_COMMUNITY): Payer: Self-pay | Admitting: Obstetrics & Gynecology

## 2015-01-05 ENCOUNTER — Telehealth: Payer: Self-pay | Admitting: Emergency Medicine

## 2015-01-05 NOTE — Telephone Encounter (Signed)
-----   Message from Jerene BearsMary S Miller, MD sent at 01/04/2015  1:01 PM EDT ----- Please call and inform pt her pathology was negative for abnormal cells and only showed an endometrial polyp.  I'd like an update on how she's done since surgery and I will see her at her follow up appt.

## 2015-01-05 NOTE — Telephone Encounter (Signed)
Spoke with patient and message from Dr. Hyacinth MeekerMiller given. Patient verbalized understanding normal results. Patient denies complaints, states she is "doing very well!". Advised post op appointment 01/20/15 with Dr. Hyacinth MeekerMiller patient agreeable. Routing to provider for final review. Patient agreeable to disposition. Will close encounter.

## 2015-01-20 ENCOUNTER — Encounter: Payer: Self-pay | Admitting: Obstetrics & Gynecology

## 2015-01-20 ENCOUNTER — Ambulatory Visit (INDEPENDENT_AMBULATORY_CARE_PROVIDER_SITE_OTHER): Payer: 59 | Admitting: Obstetrics & Gynecology

## 2015-01-20 VITALS — BP 122/84 | HR 84 | Wt 153.0 lb

## 2015-01-20 DIAGNOSIS — N840 Polyp of corpus uteri: Secondary | ICD-10-CM

## 2015-01-20 NOTE — Progress Notes (Signed)
Patient ID: Sharon Schwartz, female   DOB: 04/07/1971, 44 y.o.   MRN: [id number] Post Operative Visit  Procedure:  DILATATION & CURETTAGE/HYSTEROSCOPY WITH RESECTOCOPE  Days Post-op: 3 weeks  Subjective: Doing well.  Reviewed pathology and pictures with pt.  Pt spotted for just a few days.    Objective: BP 122/84 mmHg  Pulse 84  Wt 153 lb (69.4 kg)  LMP 09/06/2012  EXAM General: alert and cooperative Resp: clear to auscultation bilaterally Cardio: regular rate and rhythm, S1, S2 normal, no murmur, click, rub or gallop GI: soft, non-tender; bowel sounds normal; no masses,  no organomegaly Extremities: extremities normal, atraumatic, no cyanosis or edema Vaginal Bleeding: none  Gyn:  NAEFG, vaginal without lesions, no discharge or bleeding, cervix close, no CMT  Assessment: s/p hysteroscopy with polyp resection  Plan: AEX 1 year or f/u prn

## 2015-12-05 ENCOUNTER — Other Ambulatory Visit: Payer: Self-pay

## 2015-12-05 DIAGNOSIS — Z1231 Encounter for screening mammogram for malignant neoplasm of breast: Secondary | ICD-10-CM

## 2015-12-21 ENCOUNTER — Ambulatory Visit
Admission: RE | Admit: 2015-12-21 | Discharge: 2015-12-21 | Disposition: A | Discharge status: Home / Self Care | Payer: BLUE CROSS/BLUE SHIELD | Source: Ambulatory Visit

## 2015-12-21 DIAGNOSIS — Z1231 Encounter for screening mammogram for malignant neoplasm of breast: Secondary | ICD-10-CM

## 2016-01-03 ENCOUNTER — Ambulatory Visit (INDEPENDENT_AMBULATORY_CARE_PROVIDER_SITE_OTHER): Payer: BLUE CROSS/BLUE SHIELD | Admitting: Obstetrics & Gynecology

## 2016-01-03 ENCOUNTER — Encounter: Payer: Self-pay | Admitting: Obstetrics & Gynecology

## 2016-01-03 VITALS — BP 104/76 | HR 88 | Resp 16 | Ht 58.25 in | Wt 153.4 lb

## 2016-01-03 DIAGNOSIS — Z01419 Encounter for gynecological examination (general) (routine) without abnormal findings: Secondary | ICD-10-CM | POA: Diagnosis not present

## 2016-01-03 NOTE — Progress Notes (Signed)
45 y.o. [id number] MarriedAsianF here for annual exam.  Doing well.  No vaginal bleeding.  PCP:  Has appt next week.  Does blood work there.    Patient's last menstrual period was 09/06/2012.          Sexually active: Yes.    The current method of family planning is post menopausal status.    Exercising: Yes.    walking Smoker:  no  Health Maintenance: Pap: 11/01/2014 negative, HR HPV negative  History of abnormal Pap:  no MMG:  12/23/2015 BIRADS 1 negative  Colonoscopy:  none BMD:   none TDaP:  unsure Screening Labs: PCP, Hb today: PCP, Urine today: PCP   reports that she has never smoked. She has never used smokeless tobacco. She reports that she does not drink alcohol or use illicit drugs.  Past Medical History  Diagnosis Date  . Hypertension 2010  . Mild anemia     Past Surgical History  Procedure Laterality Date  . No past surgeries    . Dilatation & currettage/hysteroscopy with resectocope N/A 12/27/2014    Procedure: DILATATION & CURETTAGE/HYSTEROSCOPY WITH RESECTOCOPE;  Surgeon: Jerene Bears, MD;  Location: [facility];  Service: Gynecology;  Laterality: N/A;    Current Outpatient Prescriptions  Medication Sig Dispense Refill  . lisinopril-hydrochlorothiazide (PRINZIDE,ZESTORETIC) 20-25 MG per tablet Take 1 tablet by mouth daily.    . Loratadine (CLARITIN PO) Take 1 tablet by mouth daily as needed (for allergies).     . Multiple Vitamins-Minerals (MULTIVITAMIN PO) Take 1 tablet by mouth daily.      No current facility-administered medications for this visit.    Family History  Problem Relation Age of Onset  . Hypertension Mother     ROS:  Pertinent items are noted in HPI.  Otherwise, a comprehensive ROS was negative.  Exam:   General appearance: alert, cooperative and appears stated age Head: Normocephalic, without obvious abnormality, atraumatic Neck: no adenopathy, supple, symmetrical, trachea midline and thyroid normal to inspection and palpation Lungs: clear  to auscultation bilaterally Breasts: normal appearance, no masses or tenderness Heart: regular rate and rhythm Abdomen: soft, non-tender; bowel sounds normal; no masses,  no organomegaly Extremities: extremities normal, atraumatic, no cyanosis or edema Skin: Skin color, texture, turgor normal. No rashes or lesions Lymph nodes: Cervical, supraclavicular, and axillary nodes normal. No abnormal inguinal nodes palpated Neurologic: Grossly normal   Pelvic: External genitalia:  no lesions              Urethra:  normal appearing urethra with no masses, tenderness or lesions              Bartholins and Skenes: normal                 Vagina: normal appearing vagina with normal color and discharge, no lesions              Cervix: no lesions              Pap taken: No. Bimanual Exam:  Uterus:  normal size, contour, position, consistency, mobility, non-tender              Adnexa: normal adnexa and no mass, fullness, tenderness               Rectovaginal: Confirms               Anus:  normal sphincter tone, no lesions  Chaperone was present for exam.  A:  Well Woman with normal exam PMP, no  HRT H/O polyp resection 2016 due to PMP bleeding Obesity Hypertension  P:  Mammogram yearly discussed Pap with HR HPV obtained 2016.  No pap today. Advised pt to check with PCP about last tetanus.  Date unknown to me.  Pt unsure as well. Labs will be done with PCP next week AEX 1 year or follow up prn

## 2016-01-03 NOTE — Patient Instructions (Signed)
Check with Dr. Leonette MostKalish about last time you had a tetanus shot

## 2016-01-11 DIAGNOSIS — E669 Obesity, unspecified: Secondary | ICD-10-CM | POA: Insufficient documentation

## 2016-11-29 ENCOUNTER — Other Ambulatory Visit: Payer: Self-pay | Admitting: Obstetrics & Gynecology

## 2016-11-29 DIAGNOSIS — Z1231 Encounter for screening mammogram for malignant neoplasm of breast: Secondary | ICD-10-CM

## 2016-12-31 ENCOUNTER — Ambulatory Visit
Admission: RE | Admit: 2016-12-31 | Discharge: 2016-12-31 | Disposition: A | Discharge status: Home / Self Care | Payer: BLUE CROSS/BLUE SHIELD | Source: Ambulatory Visit | Attending: Obstetrics & Gynecology | Admitting: Obstetrics & Gynecology

## 2016-12-31 DIAGNOSIS — Z1231 Encounter for screening mammogram for malignant neoplasm of breast: Secondary | ICD-10-CM

## 2017-04-18 ENCOUNTER — Ambulatory Visit: Payer: BLUE CROSS/BLUE SHIELD | Admitting: Obstetrics & Gynecology

## 2017-07-22 ENCOUNTER — Other Ambulatory Visit: Payer: Self-pay

## 2017-07-22 ENCOUNTER — Encounter: Payer: Self-pay | Admitting: Obstetrics & Gynecology

## 2017-07-22 ENCOUNTER — Ambulatory Visit: Payer: BLUE CROSS/BLUE SHIELD | Admitting: Obstetrics & Gynecology

## 2017-07-22 VITALS — BP 120/80 | HR 76 | Resp 14 | Ht 59.0 in | Wt 149.5 lb

## 2017-07-22 DIAGNOSIS — Z01419 Encounter for gynecological examination (general) (routine) without abnormal findings: Secondary | ICD-10-CM

## 2017-07-22 NOTE — Progress Notes (Signed)
46 y.o. [id number] MarriedAsianF here for annual exam.  Denies vaginal bleeding.  Doing well.    Patient's last menstrual period was 09/06/2012.          Sexually active: Yes.    The current method of family planning is post menopausal status.    Exercising: Yes.    walking Smoker:  no  Health Maintenance: Pap:  11/01/14 negative, HR HPV negative, 04/14/12 ASCUS, HR HPV negative  History of abnormal Pap:  no MMG:  12/31/16 BIRADS 1 negative  Colonoscopy:  Never.  New guidelines were reviewed.  Will refer this next year.   BMD:   never TDaP:  01/11/16  Pneumonia vaccine(s):  never Zostavax:   never Hep C testing: not indicated  Screening Labs: PCP, Hb today: PCP, Urine today: not collected    reports that  has never smoked. she has never used smokeless tobacco. She reports that she does not drink alcohol or use drugs.  Past Medical History:  Diagnosis Date  . Hypertension 2010  . Mild anemia     Past Surgical History:  Procedure Laterality Date  . DILATATION & CURRETTAGE/HYSTEROSCOPY WITH RESECTOCOPE N/A 12/27/2014   Procedure: DILATATION & CURETTAGE/HYSTEROSCOPY WITH RESECTOCOPE;  Surgeon: Jerene BearsMary S Desteny Freeman, MD;  Location: [facility];  Service: Gynecology;  Laterality: N/A;  . NO PAST SURGERIES      Current Outpatient Medications  Medication Sig Dispense Refill  . lisinopril-hydrochlorothiazide (PRINZIDE,ZESTORETIC) 20-12.5 MG tablet Take 1 tablet by mouth daily.    . Loratadine (CLARITIN PO) Take 1 tablet by mouth daily as needed (for allergies). Reported on 01/03/2016    . Multiple Vitamins-Minerals (MULTIVITAMIN PO) Take 1 tablet by mouth daily.      No current facility-administered medications for this visit.     Family History  Problem Relation Age of Onset  . Hypertension Mother     ROS:  Pertinent items are noted in HPI.  Otherwise, a comprehensive ROS was negative.  Exam:   BP 120/80 (BP Location: Right Arm, Patient Position: Sitting, Cuff Size: Normal)   Pulse 76   Resp  14   Ht 4\' 11"  (1.499 m)   Wt 149 lb 8 oz (67.8 kg)   LMP 09/06/2012   BMI 30.20 kg/m   Weight change: -4#  Height: 4\' 11"  (149.9 cm)  Ht Readings from Last 3 Encounters:  07/22/17 4\' 11"  (1.499 m)  01/03/16 4' 10.25" (1.48 m)  12/15/14 5' (1.524 m)    General appearance: alert, cooperative and appears stated age Head: Normocephalic, without obvious abnormality, atraumatic Neck: no adenopathy, supple, symmetrical, trachea midline and thyroid normal to inspection and palpation Lungs: clear to auscultation bilaterally Breasts: normal appearance, no masses or tenderness Heart: regular rate and rhythm Abdomen: soft, non-tender; bowel sounds normal; no masses,  no organomegaly Extremities: extremities normal, atraumatic, no cyanosis or edema Skin: Skin color, texture, turgor normal. No rashes or lesions Lymph nodes: Cervical, supraclavicular, and axillary nodes normal. No abnormal inguinal nodes palpated Neurologic: Grossly normal   Pelvic: External genitalia:  no lesions              Urethra:  normal appearing urethra with no masses, tenderness or lesions              Bartholins and Skenes: normal                 Vagina: normal appearing vagina with normal color and discharge, no lesions  Cervix: no lesions              Pap taken: No. Bimanual Exam:  Uterus:  normal size, contour, position, consistency, mobility, non-tender              Adnexa: normal adnexa and no mass, fullness, tenderness               Rectovaginal: Confirms               Anus:  normal sphincter tone, no lesions  Chaperone was present for exam.  A:  Well Woman with normal exam PMP, no HRT Hypertension H/o polyp resection 2016 due to PMP bleeding  P:   Mammogram guidelines reviewed Colonoscopy guidelines discussed.  Pt knows to call with any change in bowel movement.  Will plan referral with next visit. pap smear and neg HR HPV 2016.  No pap obtained today. Lab work and vaccines UTD. return  annually or prn

## 2018-03-17 ENCOUNTER — Other Ambulatory Visit: Payer: Self-pay | Admitting: Obstetrics & Gynecology

## 2018-03-17 DIAGNOSIS — Z1231 Encounter for screening mammogram for malignant neoplasm of breast: Secondary | ICD-10-CM

## 2018-05-05 ENCOUNTER — Ambulatory Visit
Admission: RE | Admit: 2018-05-05 | Discharge: 2018-05-05 | Disposition: A | Discharge status: Home / Self Care | Payer: BLUE CROSS/BLUE SHIELD | Source: Ambulatory Visit | Attending: Obstetrics & Gynecology | Admitting: Obstetrics & Gynecology

## 2018-05-05 DIAGNOSIS — Z1231 Encounter for screening mammogram for malignant neoplasm of breast: Secondary | ICD-10-CM

## 2018-08-19 NOTE — Progress Notes (Signed)
47 y.o. [id number] Married Other or two or more races female here for annual exam.  Doing well.  Denies vaginal bleeding.  Last HbA1C was 6.2.  Got a flu shot.    Denies vaginal bleeding.    Leaving for [place] for trip in about a week to see entire family.  Has not been with all her siblings in about 20 years and they will all be together with this trip.    Patient's last menstrual period was 09/06/2012.          Sexually active: Yes.    The current method of family planning is post menopausal status.    Exercising: Yes.    Walking 7 day a week.  Smoker:  no  Health Maintenance: Pap:   11/01/14 negative, HR HPV negative, 04/14/12 ASCUS, HR HPV negative  History of abnormal Pap:  no MMG:  05/05/18 Density B Bi-rads 1 neg  Colonoscopy:  Will do after she turns 48 BMD:   Never  TDaP:  01/11/16 Pneumonia vaccine(s):  Never Shingrix:   NA Hep C testing: 2019 neg  Screening Labs: PCP   reports that she has never smoked. She has never used smokeless tobacco. She reports that she does not drink alcohol or use drugs.  Past Medical History:  Diagnosis Date  . Hypertension 2010  . Mild anemia     Past Surgical History:  Procedure Laterality Date  . DILATATION & CURRETTAGE/HYSTEROSCOPY WITH RESECTOCOPE N/A 12/27/2014   Procedure: DILATATION & CURETTAGE/HYSTEROSCOPY WITH RESECTOCOPE;  Surgeon: Jerene BearsMary S Olivine Hiers, MD;  Location: [facility];  Service: Gynecology;  Laterality: N/A;  . NO PAST SURGERIES      Current Outpatient Medications  Medication Sig Dispense Refill  . lisinopril-hydrochlorothiazide (PRINZIDE,ZESTORETIC) 20-12.5 MG tablet Take 1 tablet by mouth daily.    . Loratadine (CLARITIN PO) Take 1 tablet by mouth daily as needed (for allergies). Reported on 01/03/2016    . Multiple Vitamins-Minerals (MULTIVITAMIN PO) Take 1 tablet by mouth daily.      No current facility-administered medications for this visit.     Family History  Problem Relation Age of Onset  . Hypertension Mother      Review of Systems  All other systems reviewed and are negative.   Exam:   BP 128/74   Pulse 76   Resp 14   Ht 4' 10.5" (1.486 m)   Wt 149 lb (67.6 kg)   LMP 09/06/2012   BMI 30.61 kg/m   Height: 4' 10.5" (148.6 cm)  Ht Readings from Last 3 Encounters:  08/22/18 4' 10.5" (1.486 m)  07/22/17 4\' 11"  (1.499 m)  01/03/16 4' 10.25" (1.48 m)    General appearance: alert, cooperative and appears stated age Head: Normocephalic, without obvious abnormality, atraumatic Neck: no adenopathy, supple, symmetrical, trachea midline and thyroid normal to inspection and palpation Lungs: clear to auscultation bilaterally Breasts: normal appearance, no masses or tenderness Heart: regular rate and rhythm Abdomen: soft, non-tender; bowel sounds normal; no masses,  no organomegaly Extremities: extremities normal, atraumatic, no cyanosis or edema Skin: Skin color, texture, turgor normal. No rashes or lesions Lymph nodes: Cervical, supraclavicular, and axillary nodes normal. No abnormal inguinal nodes palpated Neurologic: Grossly normal   Pelvic: External genitalia:  no lesions              Urethra:  normal appearing urethra with no masses, tenderness or lesions              Bartholins and Skenes: normal  Vagina: normal appearing vagina with normal color and discharge, no lesions              Cervix: no lesions              Pap taken: Yes.   Bimanual Exam:  Uterus:  normal size, contour, position, consistency, mobility, non-tender              Adnexa: normal adnexa and no mass, fullness, tenderness               Rectovaginal: Confirms               Anus:  normal sphincter tone, no lesions  Chaperone was present for exam.  A:  Well Woman with normal exam PMP, no HRT Hypertension H/o polyp resection 2016 due to PMP bleeding  P:   Mammogram guidelines reviewed.  Doing yearly 3D MMG.  [facility] [place] for her next imaging due to  insurance plan.  She will schedule at this location.  Information provided. pap smear and HR HPV obtained today.  Will need to do some research about best place to send Pap smear.  Will communicate with pt about this. Lab work and vaccines are UTD Colonoscopy referral will be done after she turns 48. return annually or prn

## 2018-08-22 ENCOUNTER — Ambulatory Visit (INDEPENDENT_AMBULATORY_CARE_PROVIDER_SITE_OTHER): Payer: BLUE CROSS/BLUE SHIELD | Admitting: Obstetrics & Gynecology

## 2018-08-22 VITALS — BP 128/74 | HR 76 | Resp 14 | Ht 58.5 in | Wt 149.0 lb

## 2018-08-22 DIAGNOSIS — Z01419 Encounter for gynecological examination (general) (routine) without abnormal findings: Secondary | ICD-10-CM

## 2018-08-25 ENCOUNTER — Encounter: Payer: Self-pay | Admitting: Obstetrics & Gynecology

## 2018-09-05 ENCOUNTER — Encounter: Payer: Self-pay | Admitting: *Deleted

## 2018-09-16 ENCOUNTER — Encounter: Payer: Self-pay | Admitting: Obstetrics & Gynecology

## 2018-09-30 ENCOUNTER — Ambulatory Visit: Payer: BLUE CROSS/BLUE SHIELD | Admitting: Obstetrics & Gynecology

## 2019-08-31 ENCOUNTER — Ambulatory Visit: Payer: BLUE CROSS/BLUE SHIELD | Admitting: Obstetrics & Gynecology

## 2020-04-01 IMAGING — MG DIGITAL SCREENING BILATERAL MAMMOGRAM WITH CAD
4 series · 4 of 4 positions shown · non-contrast
Comparison: Previous exam(s).

CLINICAL DATA: Screening.

EXAM:
DIGITAL SCREENING BILATERAL MAMMOGRAM WITH CAD

[L MLO]
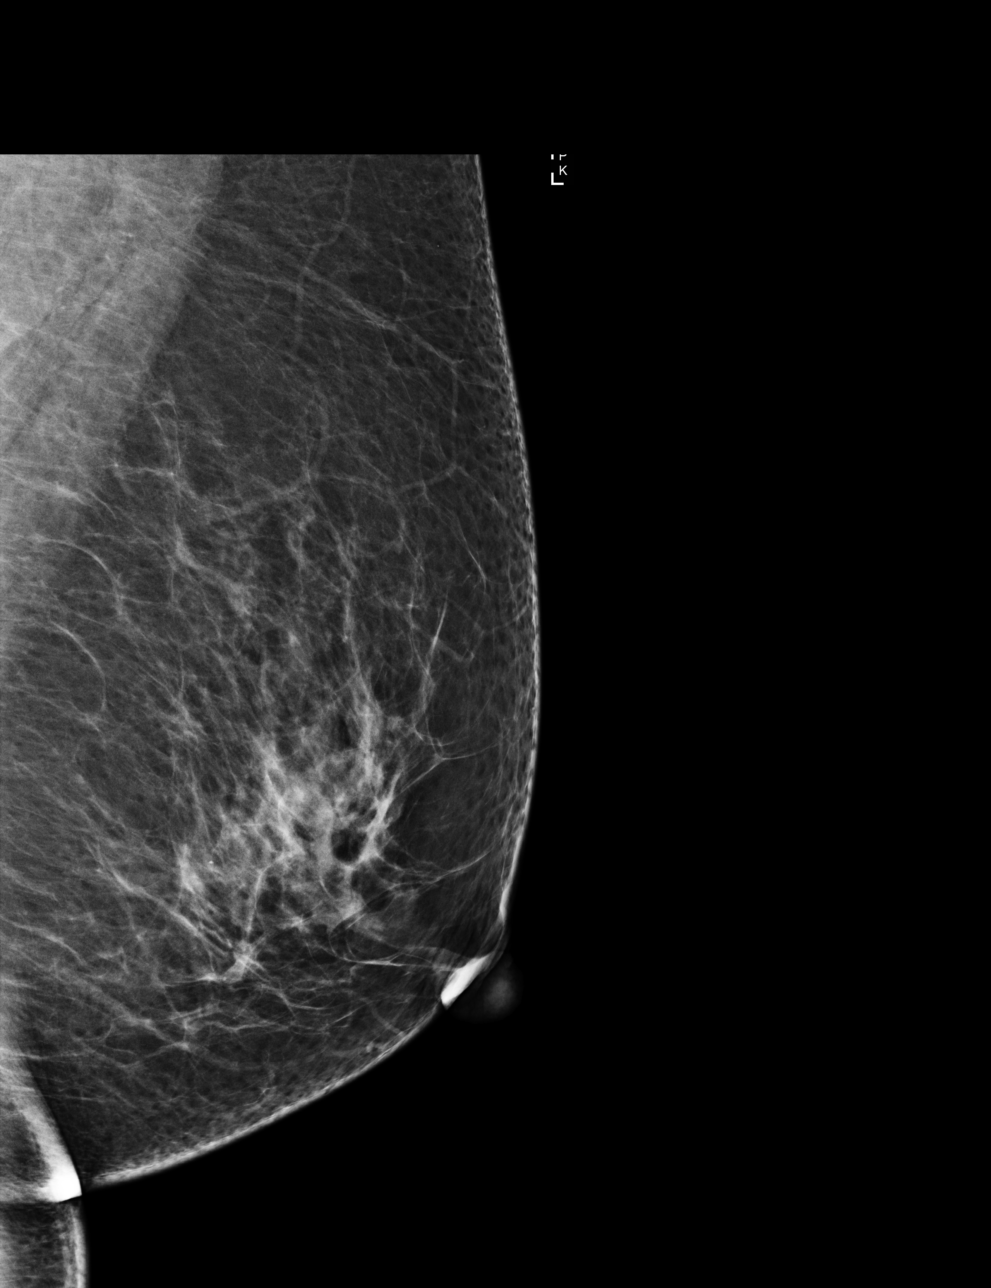

[R CC]
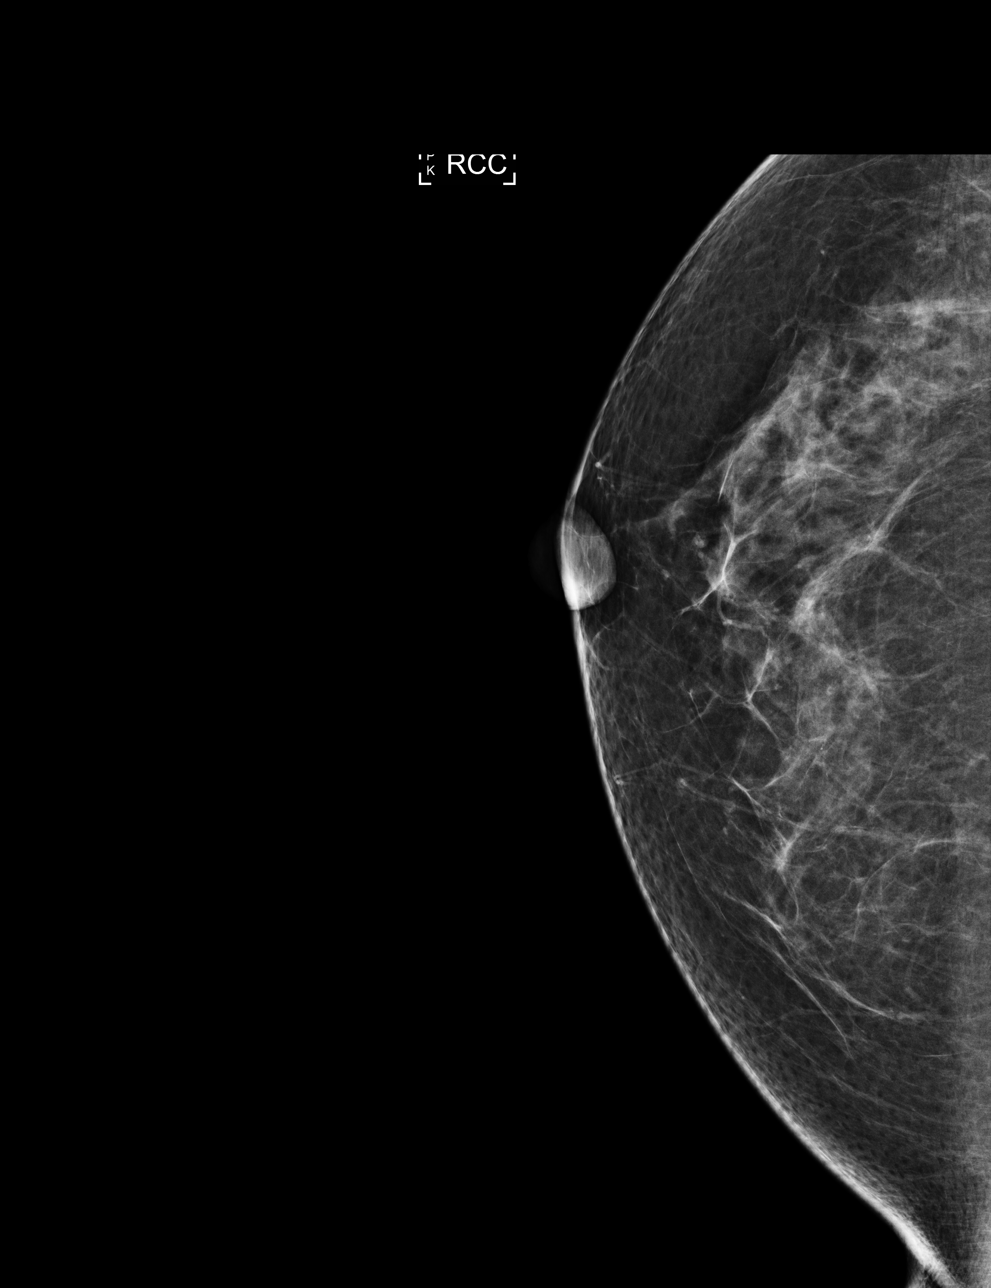

[L CC]
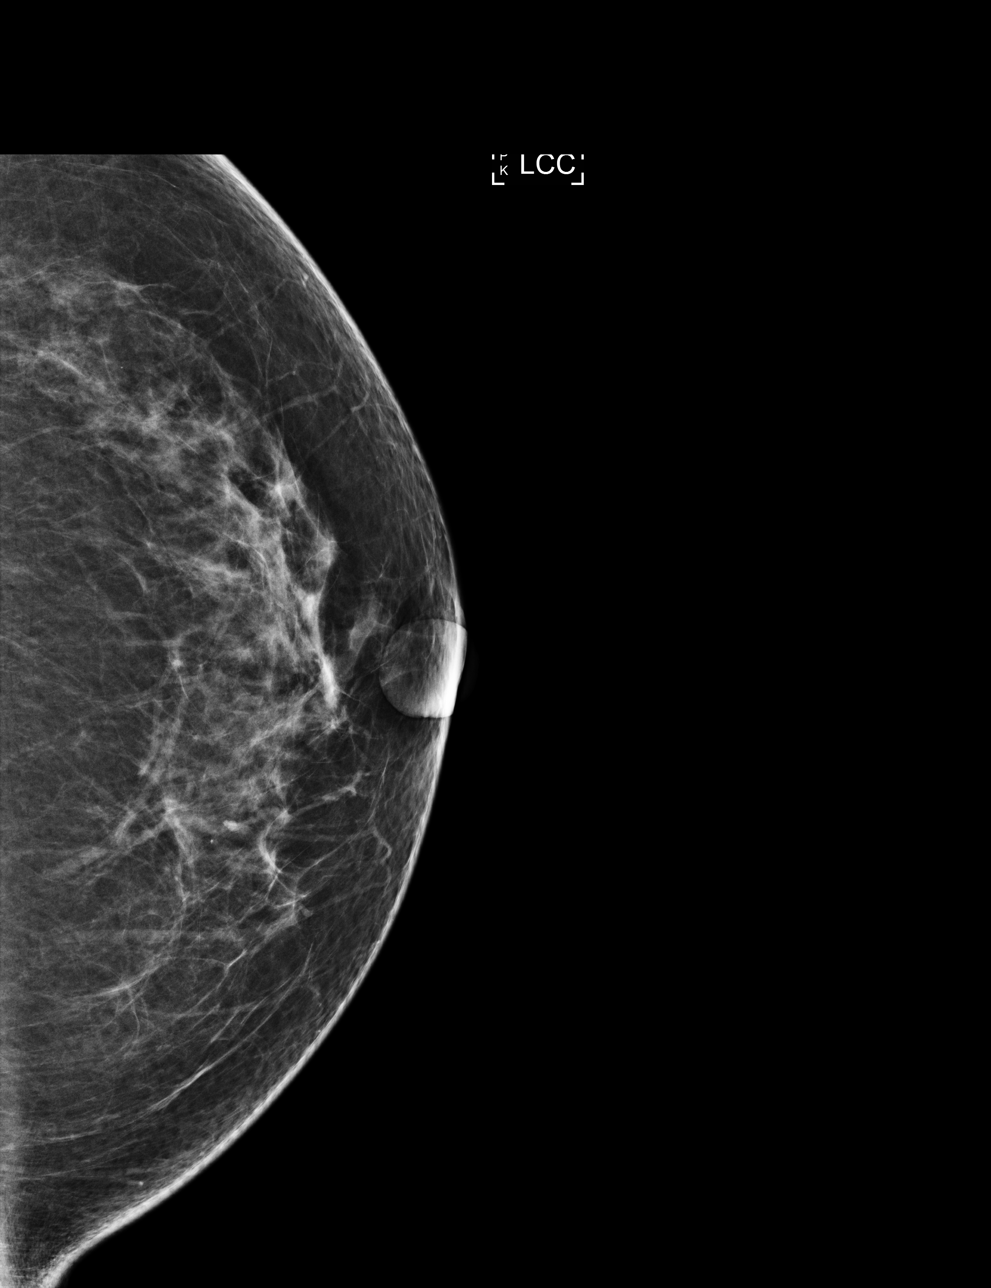

[R MLO]
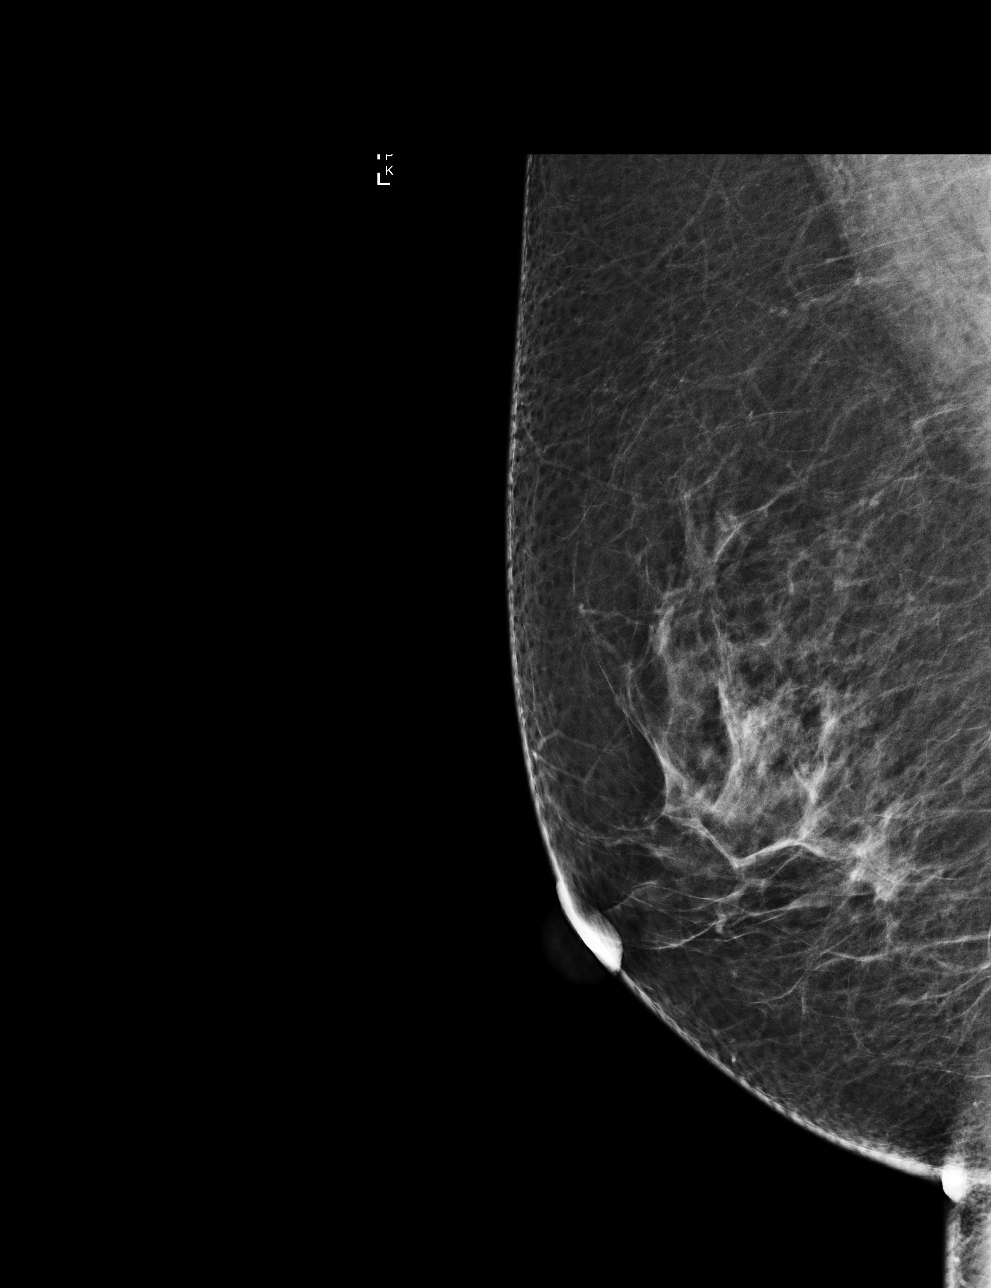

[4 of 4 positions shown; findings below may reference images not displayed]

ACR Breast Density Category b: There are scattered areas of
fibroglandular density.
FINDINGS: There are no findings suspicious for malignancy. Images were
processed with CAD.
IMPRESSION: No mammographic evidence of malignancy. A result letter of this
screening mammogram will be mailed directly to the patient.

RECOMMENDATION:
Screening mammogram in one year. (Code:AS-G-LCT)

BI-RADS CATEGORY  1: Negative.
